# Patient Record
Sex: Male | Born: 1945 | Race: White | Hispanic: No | Marital: Married
Health system: Southern US, Community
[De-identification: ages and names within clinical notes are randomized; demographics above are authoritative.]

---

## 2003-09-25 ENCOUNTER — Other Ambulatory Visit: Payer: Self-pay

## 2004-12-20 ENCOUNTER — Ambulatory Visit: Payer: Self-pay | Admitting: Family Medicine

## 2005-01-10 ENCOUNTER — Ambulatory Visit: Payer: Self-pay | Admitting: Family Medicine

## 2005-01-16 ENCOUNTER — Ambulatory Visit: Payer: Self-pay | Admitting: Family Medicine

## 2005-01-24 ENCOUNTER — Ambulatory Visit: Payer: Self-pay | Admitting: Family Medicine

## 2005-01-26 ENCOUNTER — Ambulatory Visit: Payer: Self-pay | Admitting: Oncology

## 2005-01-27 ENCOUNTER — Ambulatory Visit: Payer: Self-pay | Admitting: Oncology

## 2005-01-30 ENCOUNTER — Ambulatory Visit: Payer: Self-pay | Admitting: Oncology

## 2005-02-01 ENCOUNTER — Ambulatory Visit: Payer: Self-pay | Admitting: Gastroenterology

## 2005-02-28 ENCOUNTER — Ambulatory Visit: Payer: Self-pay | Admitting: Oncology

## 2005-03-12 ENCOUNTER — Other Ambulatory Visit: Payer: Self-pay

## 2005-03-12 ENCOUNTER — Inpatient Hospital Stay: Payer: Self-pay | Admitting: Oncology

## 2005-03-31 ENCOUNTER — Ambulatory Visit: Payer: Self-pay | Admitting: Oncology

## 2005-04-10 ENCOUNTER — Inpatient Hospital Stay: Payer: Self-pay | Admitting: Oncology

## 2005-04-28 ENCOUNTER — Ambulatory Visit: Payer: Self-pay | Admitting: Oncology

## 2005-04-30 ENCOUNTER — Ambulatory Visit: Payer: Self-pay | Admitting: Oncology

## 2005-05-12 ENCOUNTER — Ambulatory Visit: Payer: Self-pay | Admitting: Oncology

## 2005-05-31 ENCOUNTER — Ambulatory Visit: Payer: Self-pay | Admitting: Oncology

## 2005-06-30 ENCOUNTER — Ambulatory Visit: Payer: Self-pay | Admitting: Oncology

## 2005-07-14 ENCOUNTER — Ambulatory Visit: Payer: Self-pay | Admitting: Gastroenterology

## 2005-07-28 ENCOUNTER — Inpatient Hospital Stay: Payer: Self-pay | Admitting: Oncology

## 2005-07-31 ENCOUNTER — Ambulatory Visit: Payer: Self-pay | Admitting: Oncology

## 2005-08-31 ENCOUNTER — Ambulatory Visit: Payer: Self-pay | Admitting: Oncology

## 2005-09-08 ENCOUNTER — Ambulatory Visit: Payer: Self-pay | Admitting: Oncology

## 2005-09-28 ENCOUNTER — Ambulatory Visit: Payer: Self-pay | Admitting: Oncology

## 2005-10-29 ENCOUNTER — Ambulatory Visit: Payer: Self-pay | Admitting: Oncology

## 2005-11-15 ENCOUNTER — Other Ambulatory Visit: Payer: Self-pay

## 2005-11-15 ENCOUNTER — Inpatient Hospital Stay: Payer: Self-pay | Admitting: Internal Medicine

## 2005-11-28 ENCOUNTER — Ambulatory Visit: Payer: Self-pay | Admitting: Oncology

## 2006-02-21 ENCOUNTER — Ambulatory Visit: Payer: Self-pay | Admitting: Oncology

## 2006-02-28 ENCOUNTER — Ambulatory Visit: Payer: Self-pay | Admitting: Oncology

## 2006-03-28 ENCOUNTER — Emergency Department: Payer: Self-pay | Admitting: Emergency Medicine

## 2006-03-28 ENCOUNTER — Other Ambulatory Visit: Payer: Self-pay

## 2006-03-31 ENCOUNTER — Ambulatory Visit: Payer: Self-pay | Admitting: Oncology

## 2006-05-03 ENCOUNTER — Inpatient Hospital Stay: Payer: Self-pay | Admitting: Internal Medicine

## 2006-05-09 ENCOUNTER — Ambulatory Visit: Payer: Self-pay | Admitting: Oncology

## 2006-05-31 ENCOUNTER — Ambulatory Visit: Payer: Self-pay | Admitting: Oncology

## 2006-06-01 ENCOUNTER — Ambulatory Visit: Payer: Self-pay | Admitting: Oncology

## 2006-06-30 ENCOUNTER — Ambulatory Visit: Payer: Self-pay | Admitting: Oncology

## 2006-07-31 ENCOUNTER — Ambulatory Visit: Payer: Self-pay | Admitting: Oncology

## 2006-08-15 ENCOUNTER — Ambulatory Visit: Payer: Self-pay | Admitting: Surgery

## 2006-08-24 ENCOUNTER — Inpatient Hospital Stay: Payer: Self-pay | Admitting: Oncology

## 2006-08-31 ENCOUNTER — Ambulatory Visit: Payer: Self-pay | Admitting: Oncology

## 2006-09-29 ENCOUNTER — Ambulatory Visit: Payer: Self-pay | Admitting: Internal Medicine

## 2006-09-29 ENCOUNTER — Ambulatory Visit: Payer: Self-pay | Admitting: Oncology

## 2006-10-30 ENCOUNTER — Ambulatory Visit: Payer: Self-pay | Admitting: Oncology

## 2006-10-30 ENCOUNTER — Ambulatory Visit: Payer: Self-pay | Admitting: Internal Medicine

## 2006-11-14 ENCOUNTER — Other Ambulatory Visit: Payer: Self-pay

## 2006-11-14 ENCOUNTER — Inpatient Hospital Stay: Payer: Self-pay | Admitting: Surgery

## 2006-11-29 ENCOUNTER — Ambulatory Visit: Payer: Self-pay | Admitting: Oncology

## 2006-11-29 ENCOUNTER — Ambulatory Visit: Payer: Self-pay | Admitting: Internal Medicine

## 2006-12-19 ENCOUNTER — Inpatient Hospital Stay: Payer: Self-pay | Admitting: Oncology

## 2006-12-30 ENCOUNTER — Ambulatory Visit: Payer: Self-pay | Admitting: Internal Medicine

## 2006-12-30 ENCOUNTER — Ambulatory Visit: Payer: Self-pay | Admitting: Oncology

## 2007-01-29 ENCOUNTER — Ambulatory Visit: Payer: Self-pay | Admitting: Oncology

## 2007-01-29 ENCOUNTER — Ambulatory Visit: Payer: Self-pay | Admitting: Internal Medicine

## 2007-02-22 ENCOUNTER — Inpatient Hospital Stay: Payer: Self-pay | Admitting: Internal Medicine

## 2007-03-01 ENCOUNTER — Ambulatory Visit: Payer: Self-pay | Admitting: Internal Medicine

## 2007-03-01 ENCOUNTER — Ambulatory Visit: Payer: Self-pay | Admitting: Oncology

## 2007-08-15 IMAGING — CT CT ABD-PELV W/ CM
1 of 3 series · 13 of 32 positions shown, 18 images · non-contrast
Comparison: none

REASON FOR EXAM: (1) abdominal pain
COMMENTS:

[Series 2: soft tissue · axial · 0.67mm/px · z∈[-470,-62]mm · 13 of 158 slices shown, 18 images]
[im 11/158  soft-tissue]
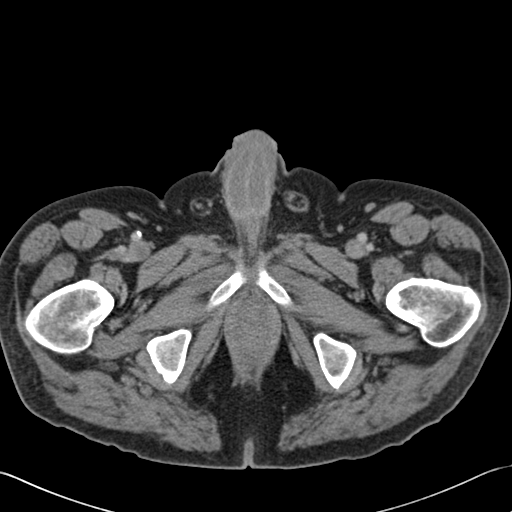
[im 11/158  bone]
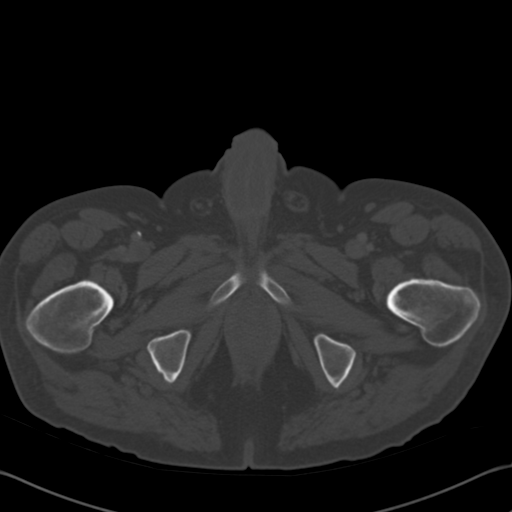
[im 21/158  soft-tissue]
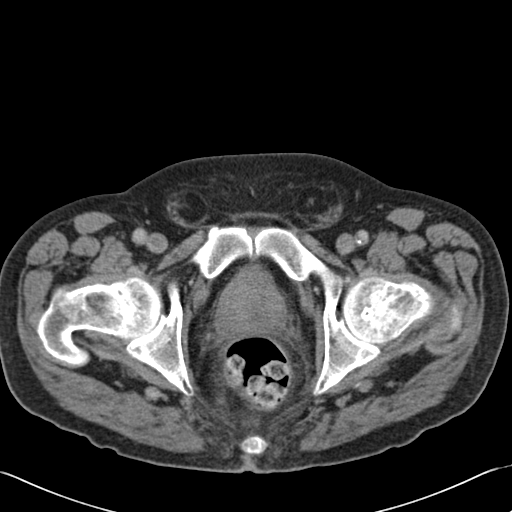
[im 32/158  soft-tissue]
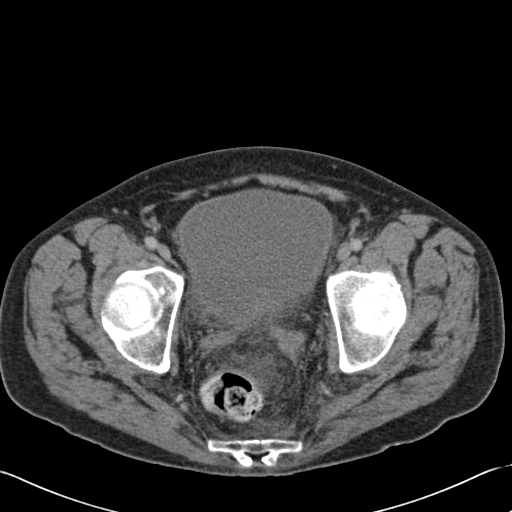
[im 53/158  soft-tissue]
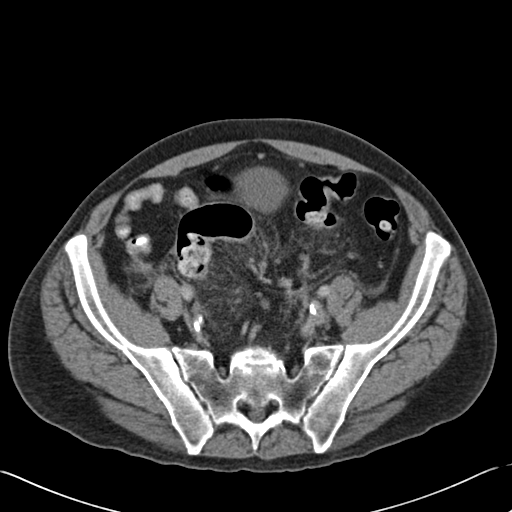
[im 63/158  soft-tissue]
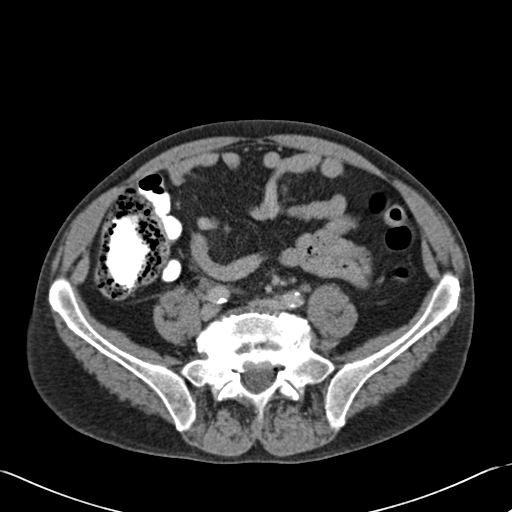
[im 74/158  soft-tissue]
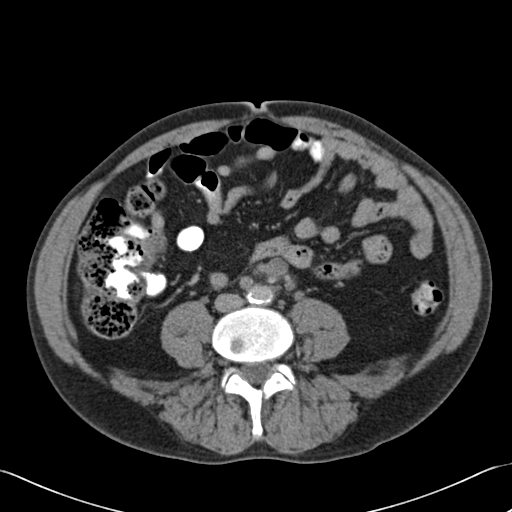
[im 84/158  soft-tissue]
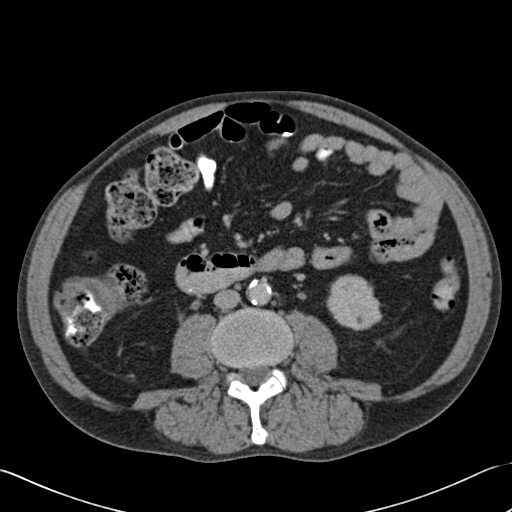
[im 95/158  soft-tissue]
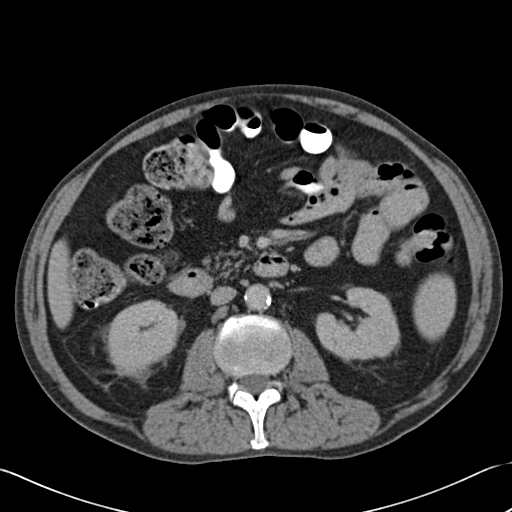
[im 105/158  soft-tissue]
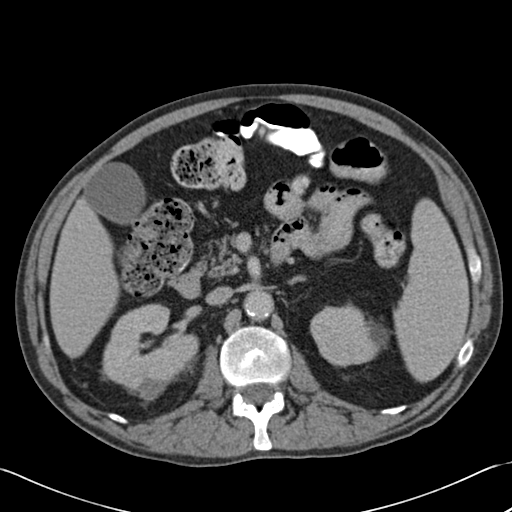
[im 105/158  bone]
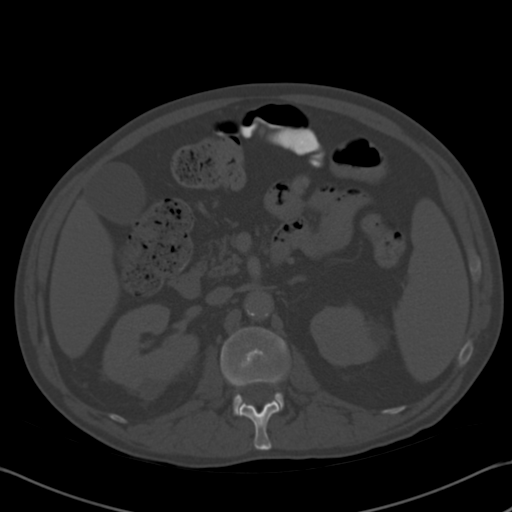
[im 116/158  lung]
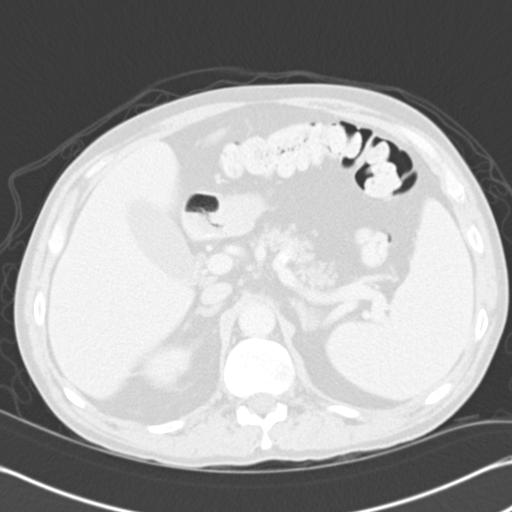
[im 126/158  soft-tissue]
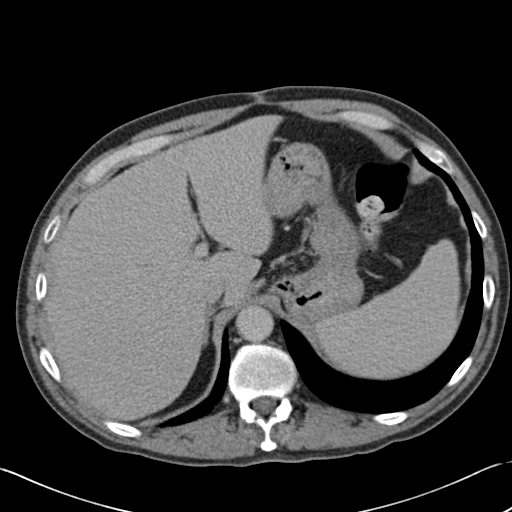
[im 126/158  lung]
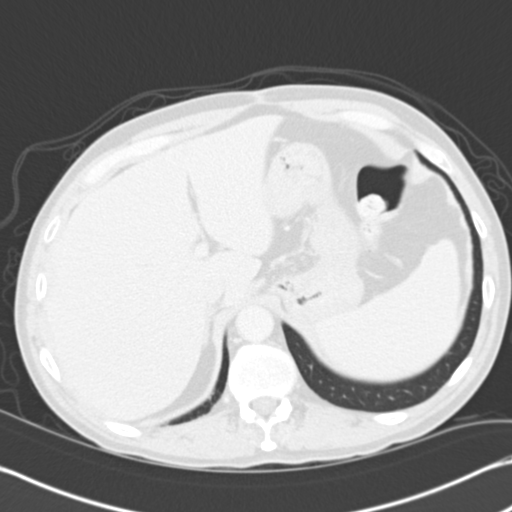
[im 137/158  soft-tissue]
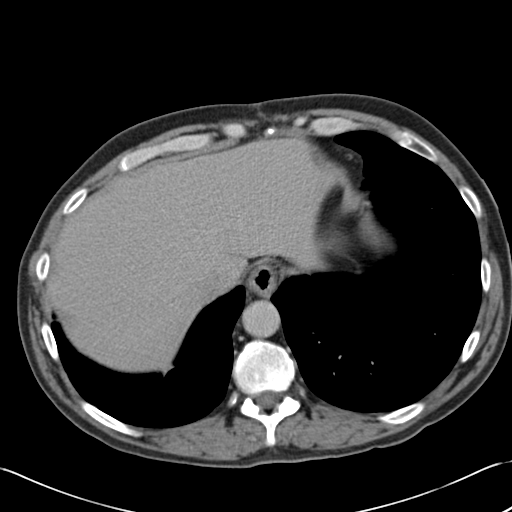
[im 137/158  lung]
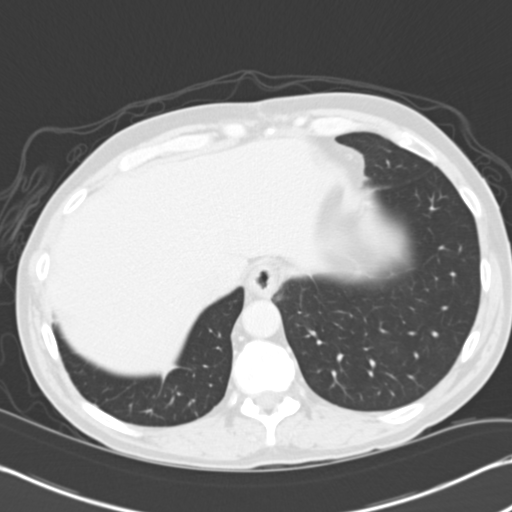
[im 147/158  soft-tissue]
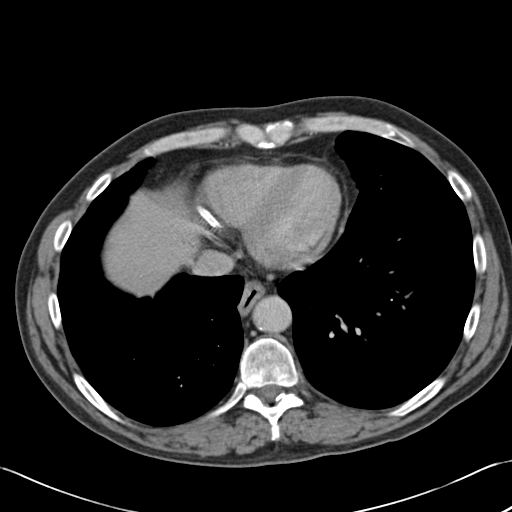
[im 147/158  lung]
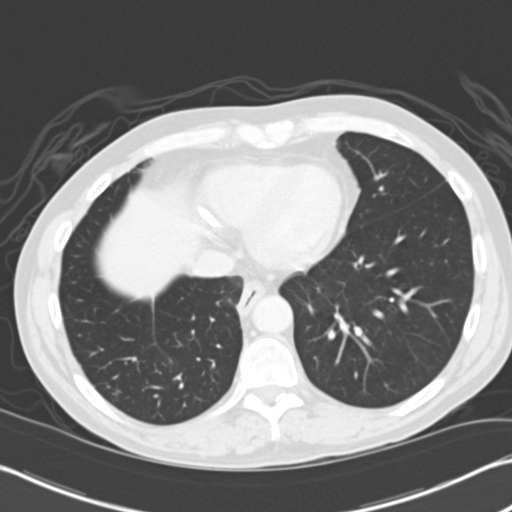

[13 of 32 positions shown; findings below may reference images not displayed]

PROCEDURE:     CT  - CT ABDOMEN / PELVIS  W  - November 15, 2006  [DATE]

RESULT:     Oral and IV contrast-enhanced CT of the abdomen and pelvis is
compared to the prior study performed without contrast on 03-28-2006.

There is an abnormal appearance in the pelvis with an air collection
demonstrated adjacent to loops of the sigmoid colon. This measures
approximately 5.7 cm LEFT to RIGHT by approximately 5 cm anterior to
posterior. The wall of the rectosigmoid region is thickened and there is
inflammatory stranding within the mesentery. There is no definite free air.
No free fluid is seen. The appearance is concerning for a developing abscess
primarily containing air likely secondary to diverticulitis. Whether there
is a fistulous communication with the bowel and this structure is uncertain.
There is atherosclerotic calcification present. There are bilateral renal
calculi seen without evidence of obstruction. Gallstones are present within
the gallbladder. There appear to be cystic areas in the kidneys suggestive
of renal cysts. There is perinephric stranding greater on the RIGHT than the
LEFT. Again, there is no evidence of urinary tract obstruction. The aorta is
normal in caliber. The low density area off the posterior aspect of the
RIGHT kidney on image #17 could represent a cyst. Follow-up with a triphasic
CT of the abdomen and pelvis would be recommended for further investigation.
The liver, spleen, pancreas and adrenal glands are normal. The lung bases
demonstrate no infiltrate, effusion or mass.
IMPRESSION: 1. Findings worrisome for an air collection extrinsic to the colon which
could represent perforation. There is no significant fluid within this. No
free air is seen elsewhere in the abdomen. This may represent a developing
abscess. Given the history of previous rectal lesion worrisome for lung
carcinoma metastatic to the rectum, the possibility of erosion through an
area of the wall of the bowel could also be considered. Surgical
consultation is suggested.
2. Low density lesion along the posterior aspect of the RIGHT kidney which
appears to be unchanged compared to prior non-contrast studies. A true
triphasic CT is suggested. The IV is positional on today's scan and a poor
contrast bolus was given because of this.
3. Findings were discussed with Dr. Simah at the time of dictation.

## 2007-09-19 IMAGING — CR DG ABDOMEN 3V
1 series · 4 of 4 positions shown · non-contrast
Comparison: none

REASON FOR EXAM: abdominal pain
COMMENTS:

[Series 1: view not recorded · 0.17mm/px · 4 of 4 slices shown]
[im 1/4]
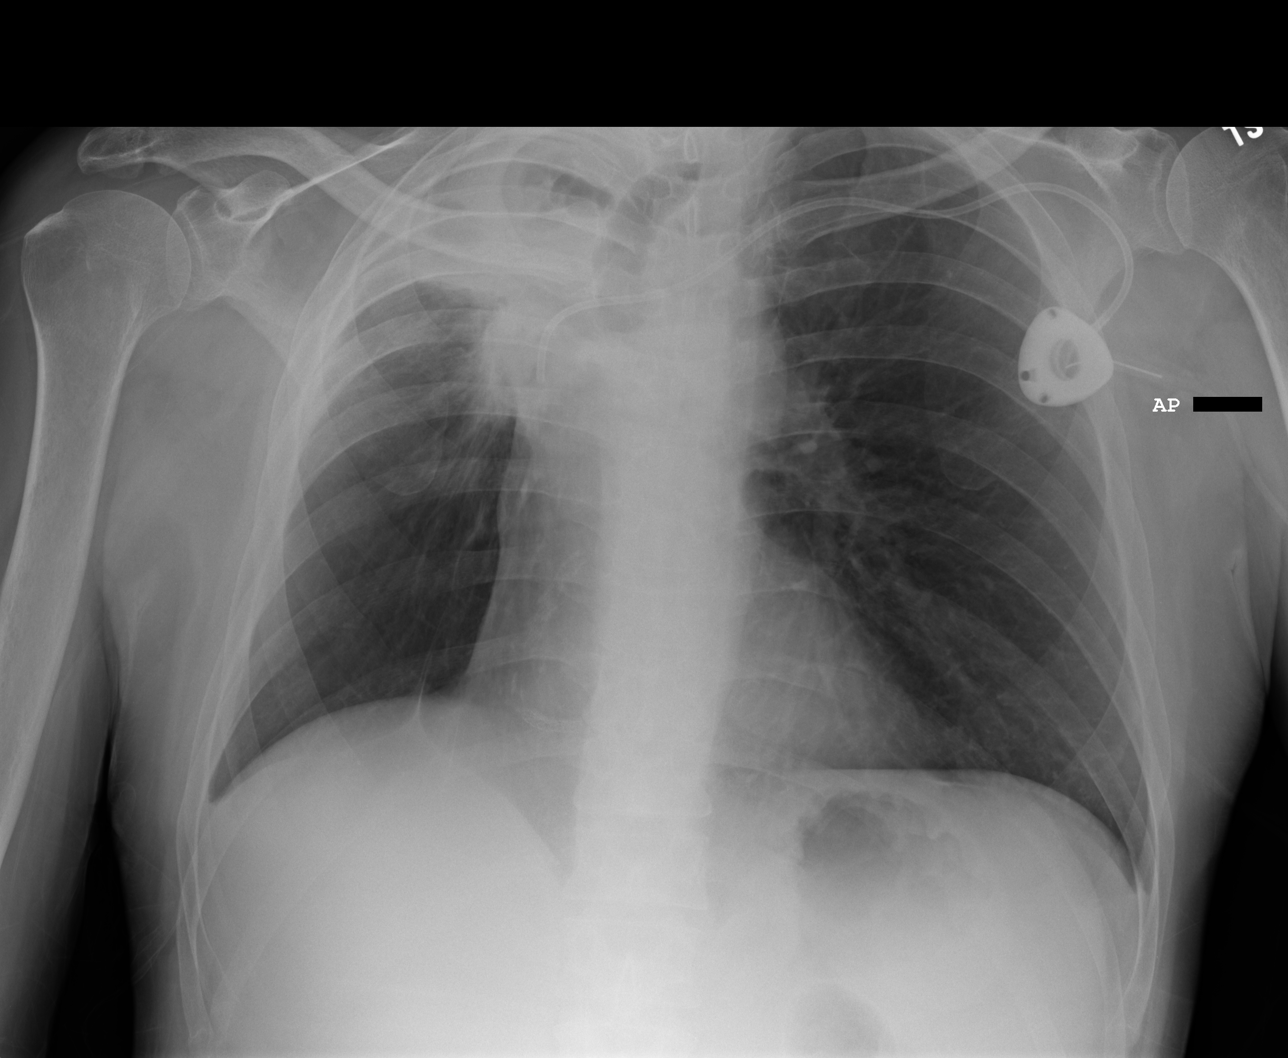
[im 2/4]
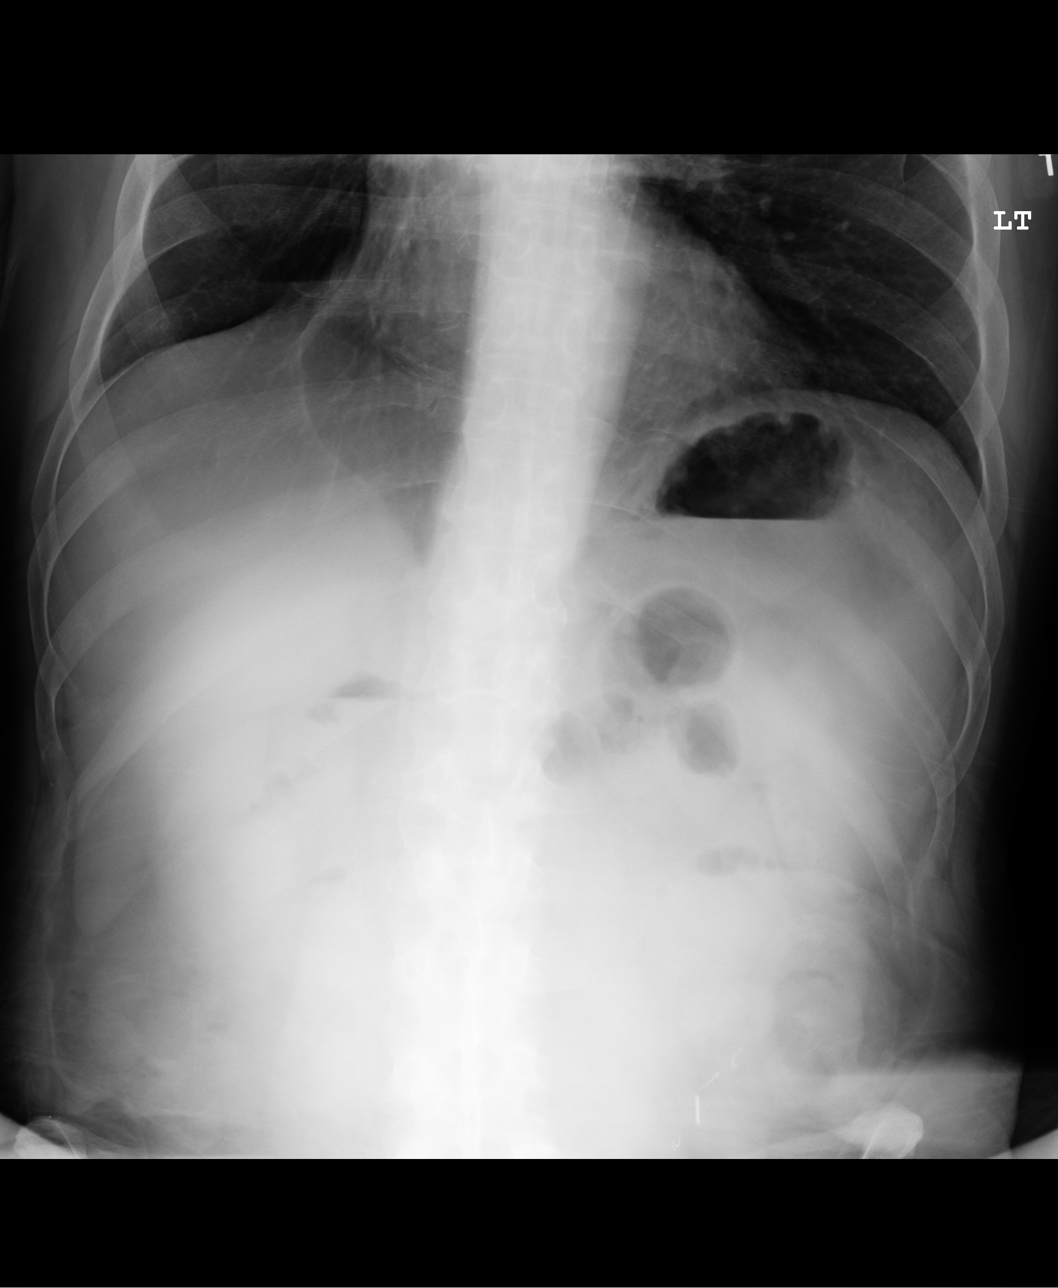
[im 3/4]
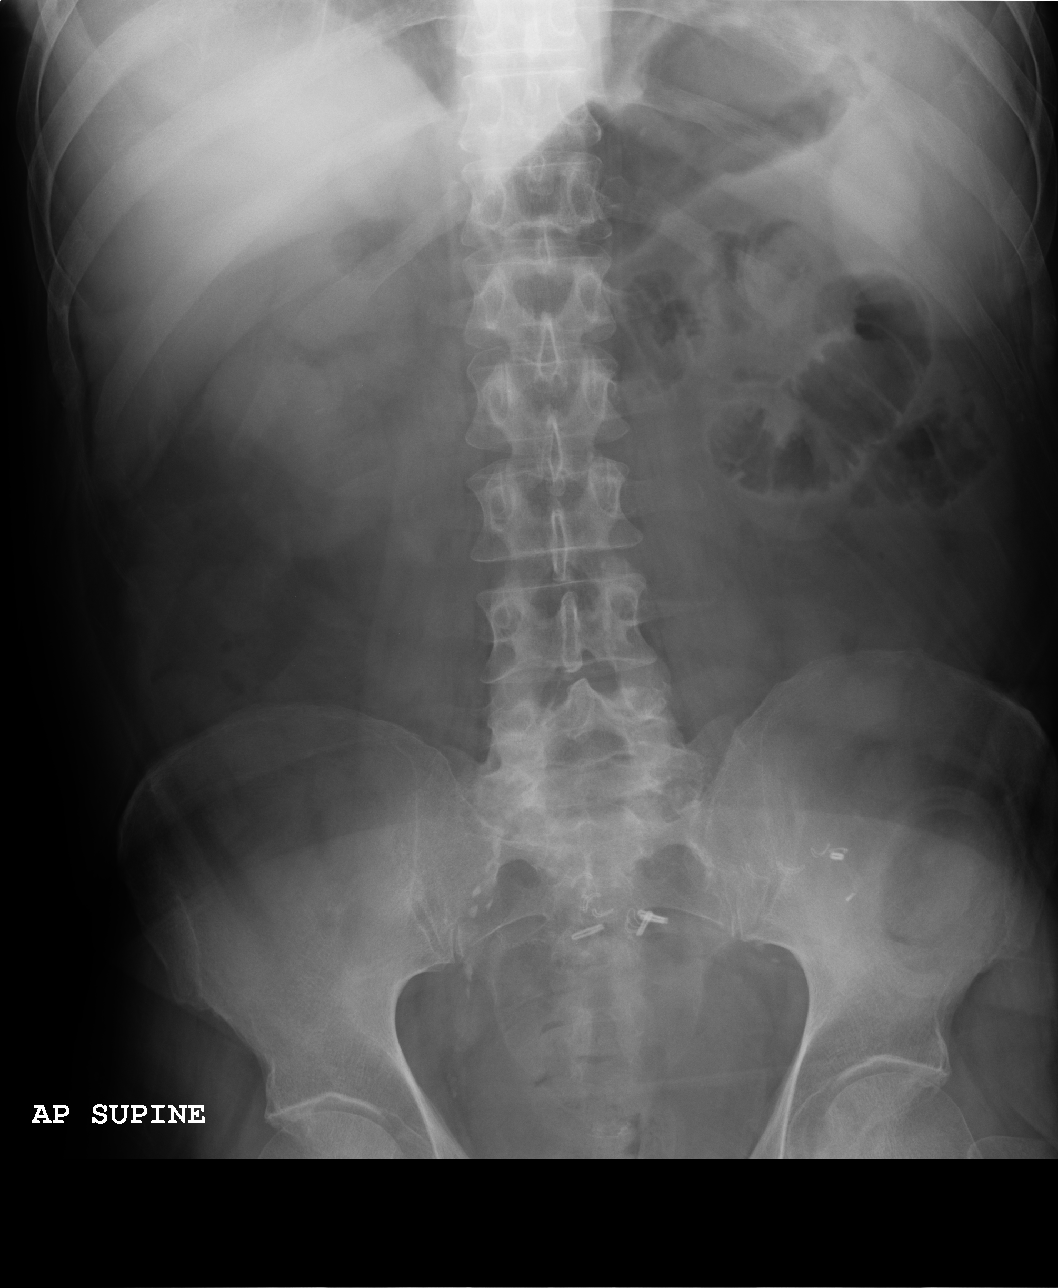
[im 4/4]
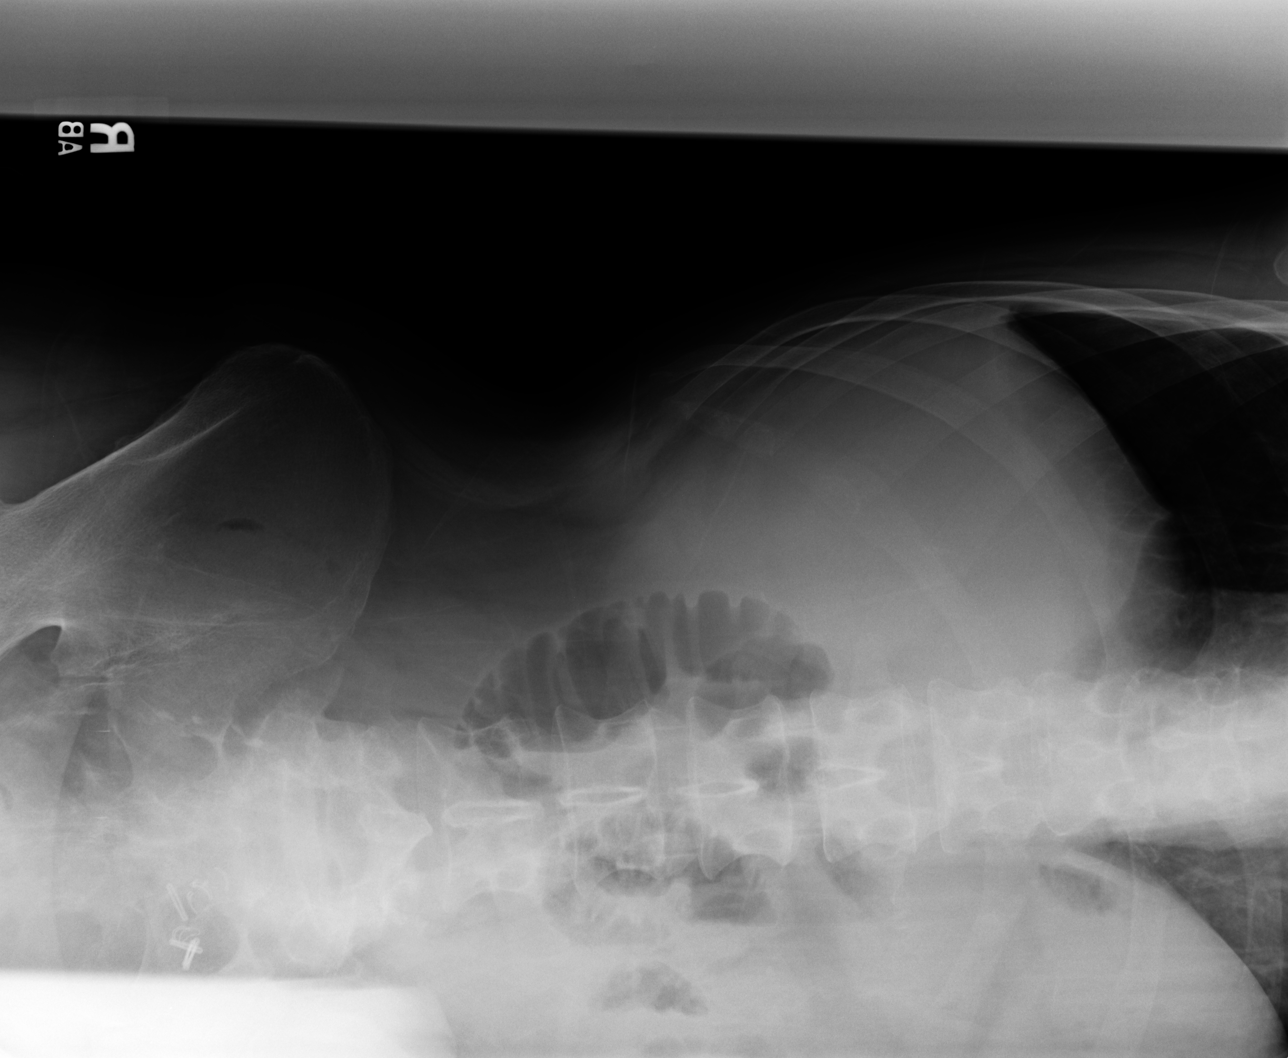

[4 of 4 positions shown; findings below may reference images not displayed]

PROCEDURE:     DXR - DXR ABDOMEN 3-WAY (INCL PA CXR)  - December 20, 2006  [DATE]

RESULT:     There is increased density in the RIGHT upper lobe, unchanged as
compared to the exam of 11/14/06.  No new pulmonary infiltrates are seen. A
Port-A-Cath is present.

Three views of the abdomen were obtained. No subdiaphragmatic free air is
observed. There are noted a few mildly dilated loops of small bowel
containing fluid levels in the upper, mid and LEFT abdomen. The findings are
most compatible with a mild ileus pattern. No findings are seen to indicate
bowel obstruction. The psoas margins are visualized bilaterally. No abnormal
intraabdominal calcifications are seen. Post surgical clips are noted in the
pelvis.
IMPRESSION: 1. Stable appearing chest as compared to the prior exam 11/14/2006.
[DATE]. There is again noted increased density in the RIGHT upper lobe as
previously reported.
3. A Port-A-Cath is present.
4. Views of the abdomen show no subdiaphragmatic free air.
5. There are noted changes compatible with a mild localized ileus in the
LEFT upper midabdomen and LEFT upper quadrant. Currently no findings of
bowel structure are seen.

## 2017-09-28 ENCOUNTER — Telehealth: Payer: Self-pay

## 2017-09-28 ENCOUNTER — Other Ambulatory Visit: Payer: Self-pay

## 2017-09-28 NOTE — Telephone Encounter (Signed)
Patient brought prescription for Hydrocodone back and states he takes Percocet.  Percocet prescription signed by Dr Pernell DupreAdams and given to patient.  Voided Hydrocodone script and sent to scan.

## 2019-12-01 ENCOUNTER — Ambulatory Visit: Payer: Self-pay | Admitting: Anesthesiology

## 2019-12-01 ENCOUNTER — Other Ambulatory Visit: Payer: Self-pay
